# Patient Record
Sex: Male | Born: 1955 | Race: Black or African American | Hispanic: No | Marital: Single | Smoking: Never smoker
Health system: Southern US, Community
[De-identification: ages and names within clinical notes are randomized; demographics above are authoritative.]

## PROBLEM LIST (undated history)

## (undated) DIAGNOSIS — I10 Essential (primary) hypertension: Secondary | ICD-10-CM

## (undated) DIAGNOSIS — E119 Type 2 diabetes mellitus without complications: Secondary | ICD-10-CM

## (undated) HISTORY — PX: KNEE SURGERY: SHX244

---

## 2000-02-10 ENCOUNTER — Emergency Department (HOSPITAL_COMMUNITY): Admission: EM | Admit: 2000-02-10 | Discharge: 2000-02-10 | Payer: Self-pay

## 2002-10-18 ENCOUNTER — Ambulatory Visit (HOSPITAL_BASED_OUTPATIENT_CLINIC_OR_DEPARTMENT_OTHER): Admission: RE | Admit: 2002-10-18 | Discharge: 2002-10-18 | Payer: Self-pay | Admitting: Orthopaedic Surgery

## 2003-03-20 ENCOUNTER — Emergency Department (HOSPITAL_COMMUNITY): Admission: EM | Admit: 2003-03-20 | Discharge: 2003-03-21 | Payer: Self-pay | Admitting: Emergency Medicine

## 2003-04-11 ENCOUNTER — Ambulatory Visit (HOSPITAL_COMMUNITY): Admission: RE | Admit: 2003-04-11 | Discharge: 2003-04-11 | Payer: Self-pay | Admitting: Nephrology

## 2003-04-11 ENCOUNTER — Encounter: Payer: Self-pay | Admitting: Nephrology

## 2003-04-21 ENCOUNTER — Encounter: Payer: Self-pay | Admitting: Nephrology

## 2003-04-21 ENCOUNTER — Ambulatory Visit (HOSPITAL_COMMUNITY): Admission: RE | Admit: 2003-04-21 | Discharge: 2003-04-21 | Payer: Self-pay | Admitting: Nephrology

## 2003-07-24 ENCOUNTER — Encounter: Payer: Self-pay | Admitting: Nephrology

## 2003-07-24 ENCOUNTER — Encounter: Admission: RE | Admit: 2003-07-24 | Discharge: 2003-07-24 | Payer: Self-pay | Admitting: Nephrology

## 2004-10-25 ENCOUNTER — Ambulatory Visit (HOSPITAL_COMMUNITY): Admission: RE | Admit: 2004-10-25 | Discharge: 2004-10-25 | Payer: Self-pay | Admitting: Nephrology

## 2005-03-19 IMAGING — RF DG BE W/ CM - WO/W KUB
17 of 24 series · 17 of 24 positions shown · non-contrast
Comparison: none

CLINICAL DATA: The patient has a history of constipation.
 BARIUM ENEMA:
 The preliminary KUB is unremarkable.  
 No obstruction is noted to the retrograde flow of barium with the cecum being well filled with reflux of the appendix.  The terminal ileum does not reflux.  There is retained feces throughout the colon, particularly in the region of the splenic flexure.
 There are some scattered small diverticula noted particularly in the sigmoid colon.

[Series 1: run · 1 of 1 slices shown (1 of 15)]
[im 1/1]
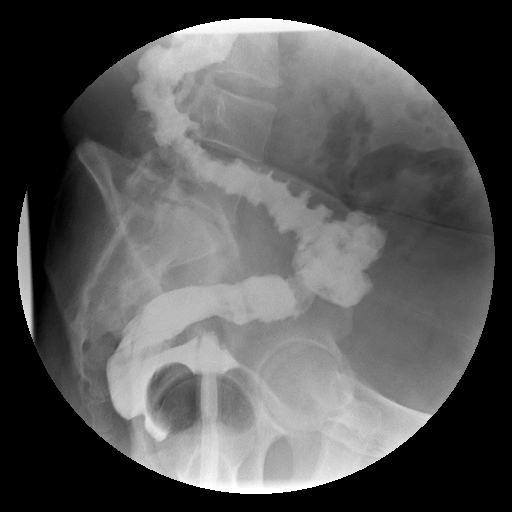

[Series 3: run · 1 of 1 slices shown (2 of 15)]
[im 1/1]
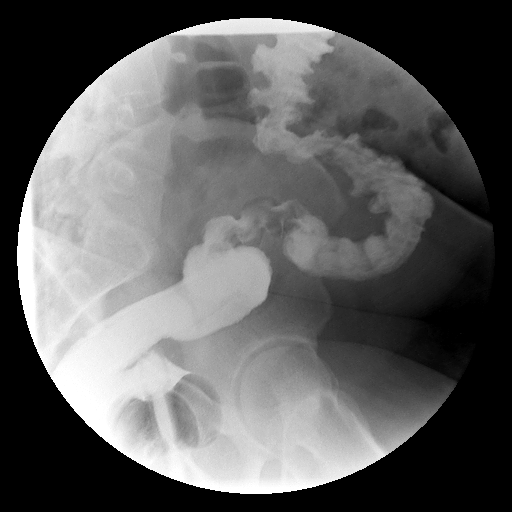

[Series 4: run · 1 of 1 slices shown (3 of 15)]
[im 1/1]
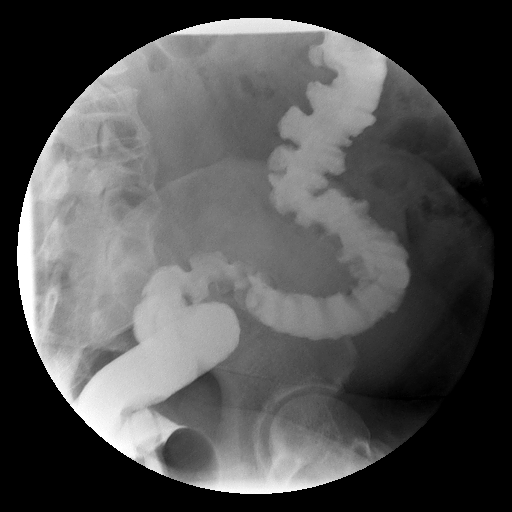

[Series 5: run · 1 of 1 slices shown (4 of 15)]
[im 1/1]
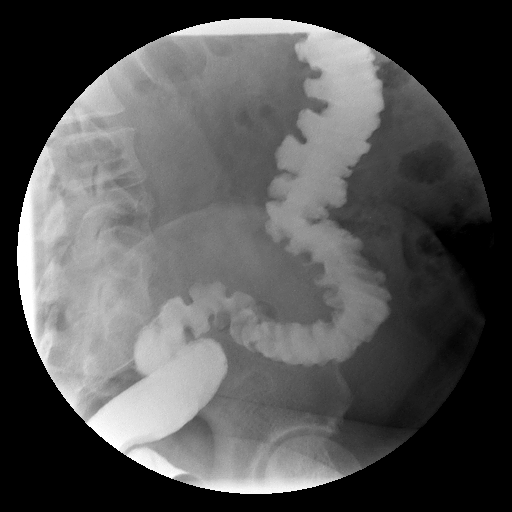

[Series 7: run · 1 of 1 slices shown (5 of 15)]
[im 1/1]
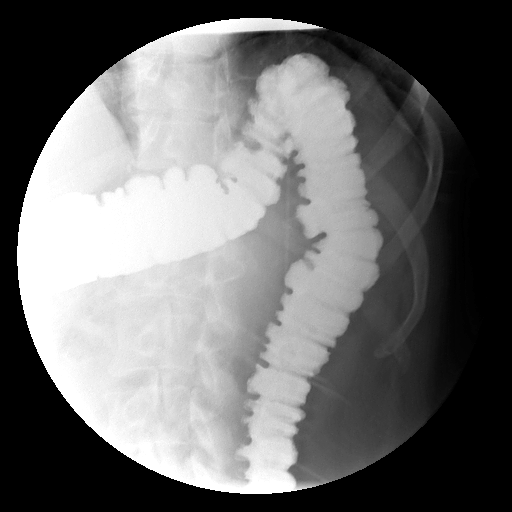

[Series 8: run · 1 of 1 slices shown (6 of 15)]
[im 1/1]
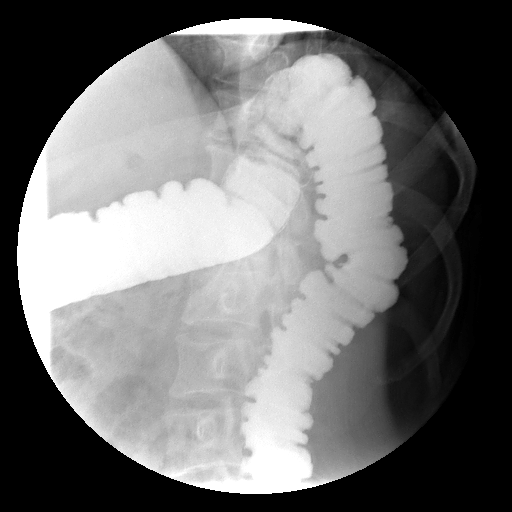

[Series 10: run · 1 of 1 slices shown (7 of 15)]
[im 1/1]
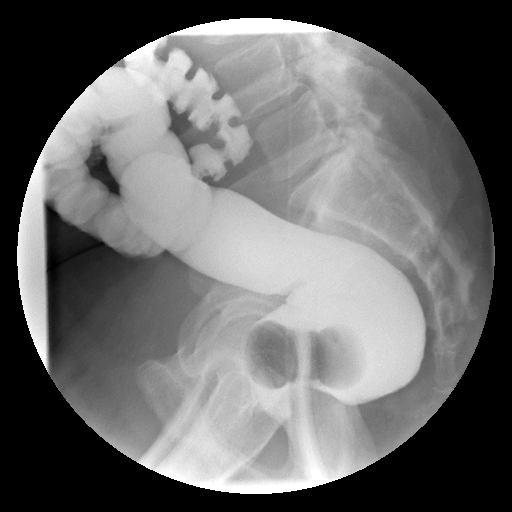

[Series 11: run · 1 of 1 slices shown (8 of 15)]
[im 1/1]
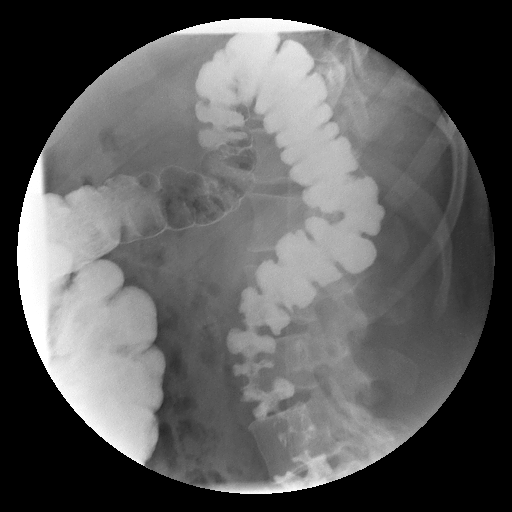

[Series 13: run · 1 of 1 slices shown (9 of 15)]
[im 1/1]
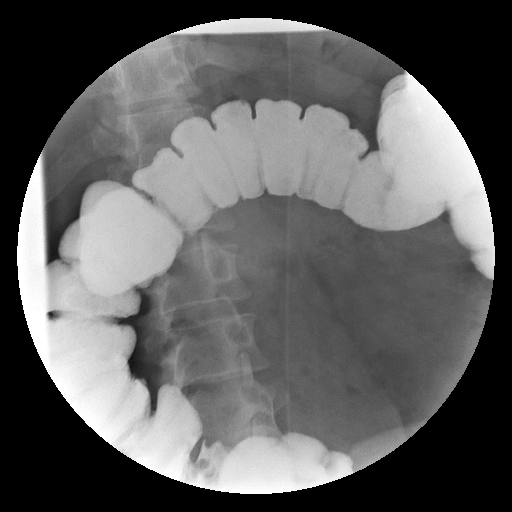

[Series 14: run · 1 of 1 slices shown (10 of 15)]
[im 1/1]
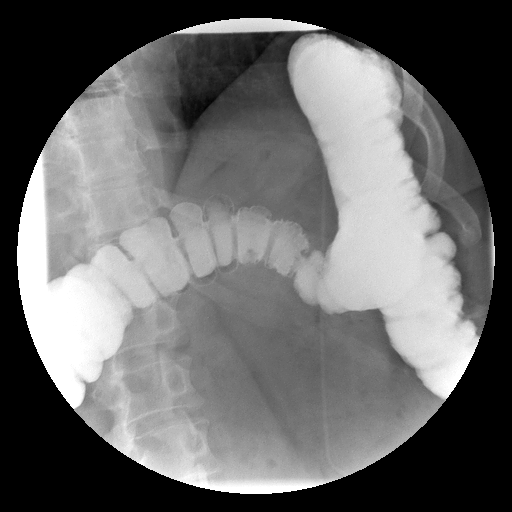

[Series 15: run · 1 of 1 slices shown (11 of 15)]
[im 1/1]
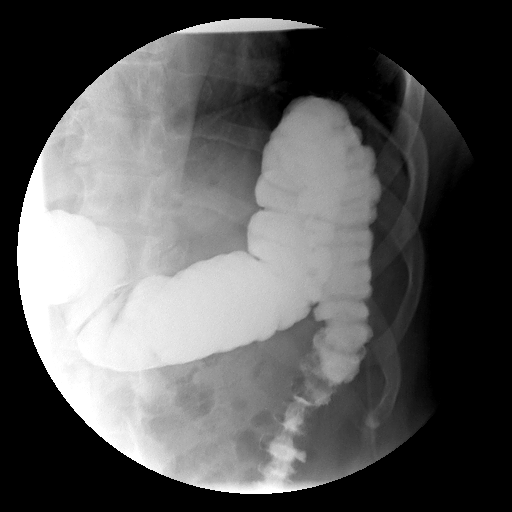

[Series 17: run · 1 of 1 slices shown (12 of 15)]
[im 1/1]
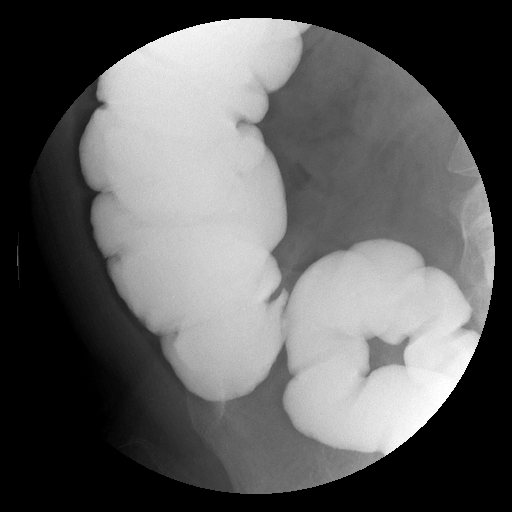

[Series 18: run · 1 of 1 slices shown (13 of 15)]
[im 1/1]
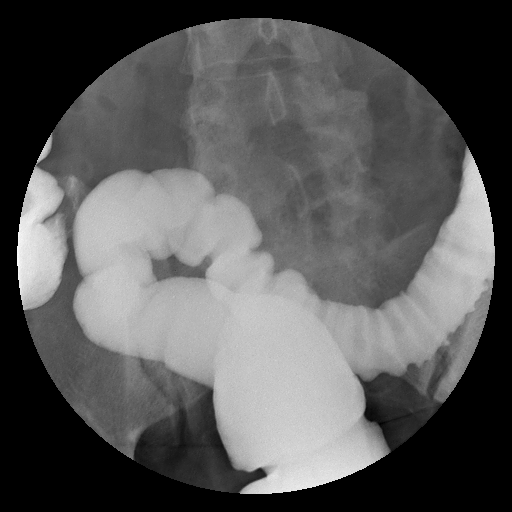

[Series 20: run · 1 of 1 slices shown (14 of 15)]
[im 1/1]
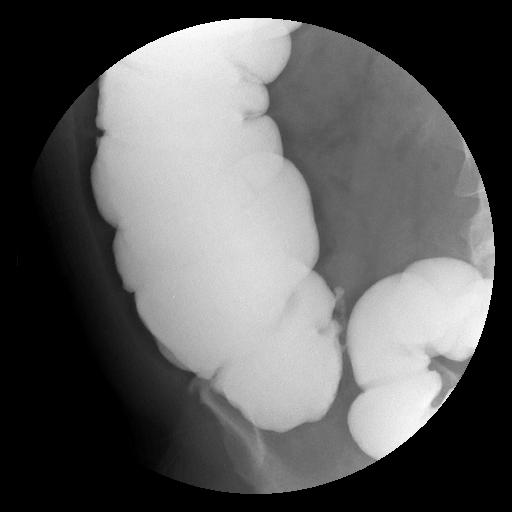

[Series 21: run · 1 of 1 slices shown (15 of 15)]
[im 1/1]
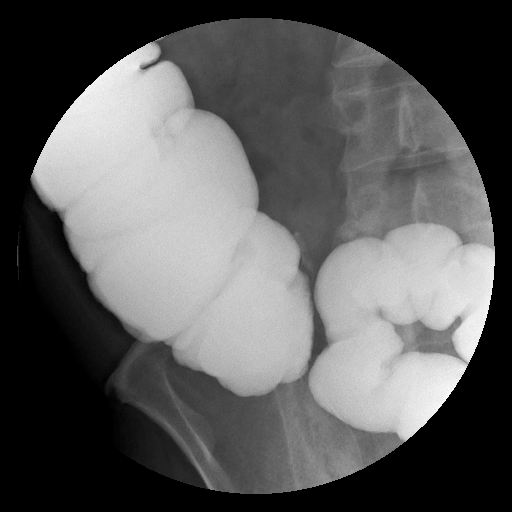

[Series 1001: view not recorded · 0.20mm/px · 1 of 1 slices shown (1 of 2)]
[im 1/1]
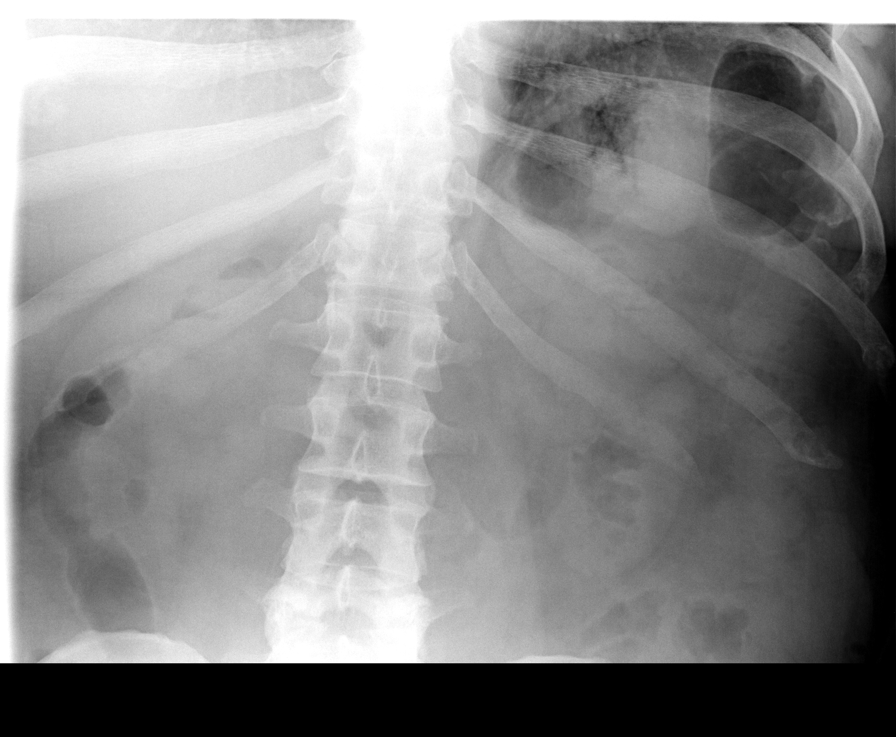

[Series 1003: view not recorded · 0.20mm/px · 1 of 1 slices shown (2 of 2)]
[im 1/1]
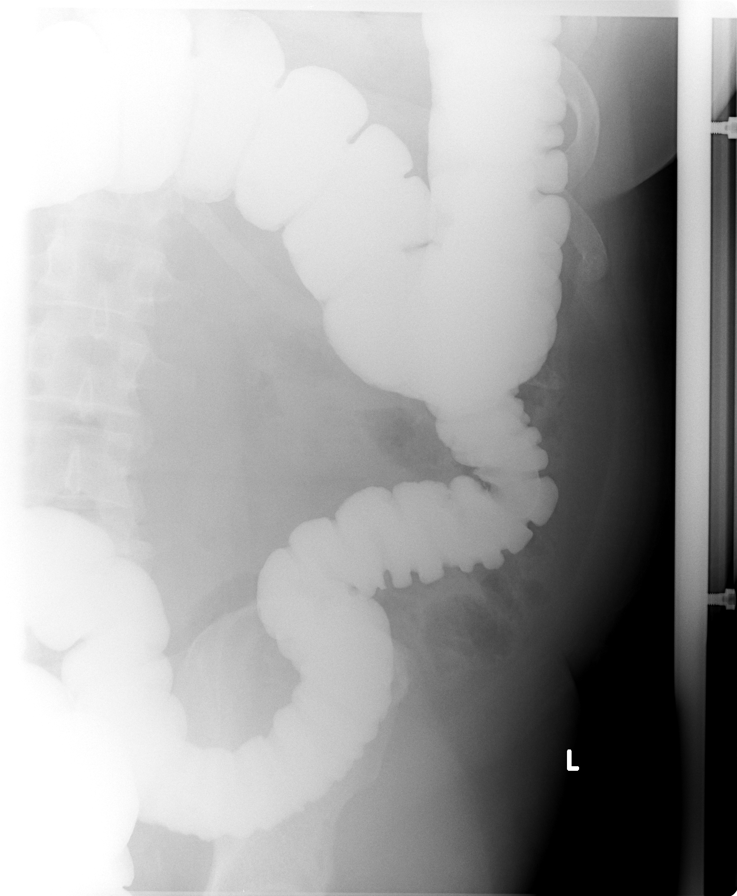

[17 of 24 positions shown; findings below may reference images not displayed]

IMPRESSION: 1.  Mild degree of diverticulosis of the sigmoid colon.
 2.  No obstructing masses.
 3.  There is retained feces.

## 2005-07-05 ENCOUNTER — Encounter (INDEPENDENT_AMBULATORY_CARE_PROVIDER_SITE_OTHER): Payer: Self-pay | Admitting: Specialist

## 2005-07-05 ENCOUNTER — Ambulatory Visit (HOSPITAL_COMMUNITY): Admission: RE | Admit: 2005-07-05 | Discharge: 2005-07-05 | Payer: Self-pay | Admitting: Gastroenterology

## 2006-08-26 ENCOUNTER — Emergency Department (HOSPITAL_COMMUNITY): Admission: EM | Admit: 2006-08-26 | Discharge: 2006-08-26 | Payer: Self-pay | Admitting: Emergency Medicine

## 2010-01-12 ENCOUNTER — Emergency Department (HOSPITAL_BASED_OUTPATIENT_CLINIC_OR_DEPARTMENT_OTHER): Admission: EM | Admit: 2010-01-12 | Discharge: 2010-01-12 | Payer: Self-pay | Admitting: Emergency Medicine

## 2011-05-06 NOTE — Op Note (Signed)
NAME:  Dalton Larson, Dalton Larson                        ACCOUNT NO.:  [id number]   MEDICAL RECORD NO.:  [id number]                   PATIENT TYPE:  AMB   LOCATION:  [facility]                                  FACILITY:  [facility]   PHYSICIAN:  Lubertha Basque. Jerl Santos, M.D.             DATE OF BIRTH:  [birth year]   DATE OF PROCEDURE:  10/18/2002  DATE OF DISCHARGE:  10/18/2002                                 OPERATIVE REPORT   PREOPERATIVE DIAGNOSIS:  Left patellar tendon rupture.   POSTOPERATIVE DIAGNOSIS:  Left patellar tendon rupture.   PROCEDURE:  Left patellar tendon repair.   ANESTHESIA:  General.   SURGEON:  Lubertha Basque. Jerl Santos, M.D.   ASSISTANT:  Lindwood Qua, P.A.   INDICATIONS FOR PROCEDURE:  The patient is a 55 year old man who injured  himself recently and has been unable to extend his leg or get around, due to  this injury.  He was seen at an Urgent Care Facility, and then in our  office.  He was noted to have a patellar tendon rupture.  He had a high-  riding patella on x-ray, and on examination had an obvious defect in the  area of his patellar tendon.  He is offered repair in hopes that he will be  able to have a functional leg once again.  The procedure was discussed with  the patient and an informed operative consent was obtained, after a  discussion of the possible complications of, reaction to anesthesia,  infection and knee stiffness.   DESCRIPTION OF PROCEDURE:  The patient was taken to the operating suite  where a general anesthetic was applied  Without difficulty.  He was positioned supine and prepped and draped in a  normal sterile fashion.  After the administration of preoperative  antibiotics, the left leg was elevated, exsanguinated, and a tourniquet  inflated about the thigh.  A longitudinal incision was made, centered over  the area of the patella tendon.  Dissection was carried down to this  complete rupture which was off the inferior pole of the patella.  Bunnell  stitches were placed using #2 Ethibond suture in the patellar tendon stump.  These were then passed through three longitudinal drill holes in the patella  and tied on the superior aspect.  This reapproximated the tendon well.  The  knee ranged to 90 degrees without much difficulty at the end of the case.  The tourniquet was deflated and the leg became pink and warm immediately.  A  mild amount of bleeding was easily controlled with the Bovie cautery.  Some  PDS suture was used to reapproximate the retinaculum which was also torn in  both aspects of the knee.  The subcutaneous tissue was reapproximated with  #0 and #2-0 undyed Vicryl, followed by a skin closure with staples.  Adaptic  was applied to the wound after some Marcaine was injected around the  incision site.  Dry gauze  and then an Ace wrap, along with a knee  immobilizer were applied.  He was admitted for overnight observation with a potential discharge home  the same day or 23-hour observation.    DISPOSITION:  The patient was extubated in the operating room and taken to  the recovery room in stable condition.   PLAN:  For him to stay overnight.  Will possibly go home the same day.                                                Lubertha Basque Jerl Santos, M.D.    PGD/MEDQ  D:  12/03/2002  T:  12/03/2002  Job:  [id number]

## 2013-02-09 ENCOUNTER — Encounter (HOSPITAL_BASED_OUTPATIENT_CLINIC_OR_DEPARTMENT_OTHER): Payer: Self-pay | Admitting: *Deleted

## 2013-02-09 ENCOUNTER — Emergency Department (HOSPITAL_BASED_OUTPATIENT_CLINIC_OR_DEPARTMENT_OTHER)
Admission: EM | Admit: 2013-02-09 | Discharge: 2013-02-09 | Disposition: A | Discharge status: Home / Self Care | Payer: Self-pay | Attending: Emergency Medicine | Admitting: Emergency Medicine

## 2013-02-09 DIAGNOSIS — I10 Essential (primary) hypertension: Secondary | ICD-10-CM | POA: Insufficient documentation

## 2013-02-09 DIAGNOSIS — E119 Type 2 diabetes mellitus without complications: Secondary | ICD-10-CM | POA: Insufficient documentation

## 2013-02-09 DIAGNOSIS — J3489 Other specified disorders of nose and nasal sinuses: Secondary | ICD-10-CM | POA: Insufficient documentation

## 2013-02-09 DIAGNOSIS — Z79899 Other long term (current) drug therapy: Secondary | ICD-10-CM | POA: Insufficient documentation

## 2013-02-09 DIAGNOSIS — R0981 Nasal congestion: Secondary | ICD-10-CM

## 2013-02-09 HISTORY — DX: Essential (primary) hypertension: I10

## 2013-02-09 HISTORY — DX: Type 2 diabetes mellitus without complications: E11.9

## 2013-02-09 MED ORDER — OXYMETAZOLINE HCL 0.05 % NA SOLN
2.0000 | Freq: Two times a day (BID) | NASAL | Status: DC | PRN
  Administered 2013-02-09: 2 via NASAL
  Filled 2013-02-09: qty 15

## 2013-02-09 NOTE — ED Notes (Signed)
Pt c/o sinus congestion x2-3 days and sts he is short of breath. Pt sts no relief from OTC meds.

## 2013-02-09 NOTE — ED Provider Notes (Signed)
History     CSN: [id number]  Arrival date & time 02/09/13  0235   First MD Initiated Contact with Patient 02/09/13 [phone]      Chief Complaint  Patient presents with  . Nasal Congestion    (Consider location/radiation/quality/duration/timing/severity/associated sxs/prior treatment) HPI This is a 57 year old male with a three-day history of nasal congestion and sinus pressure. He states it worsened within the last 24 hours. He considers his symptoms moderate to severe. He denies frank pain. He has had a subjective fever. He denies cough, nausea or vomiting. He has had diarrhea for the past 2 days. He has been trying an over-the-counter product without relief. He has not tried any nasal sprays.  Past Medical History  Diagnosis Date  . Diabetes mellitus without complication   . Hypertension     Past Surgical History  Procedure Laterality Date  . Knee surgery      No family history on file.  History  Substance Use Topics  . Smoking status: Never Smoker   . Smokeless tobacco: Not on file  . Alcohol Use: No      Review of Systems  All other systems reviewed and are negative.    Allergies  Review of patient's allergies indicates no known allergies.  Home Medications   Current Outpatient Rx  Name  Route  Sig  Dispense  Refill  . amLODipine (NORVASC) 5 MG tablet   Oral   Take 5 mg by mouth daily.         . metFORMIN (GLUCOPHAGE) 500 MG tablet   Oral   Take 500 mg by mouth 2 (two) times daily with a meal.           BP 158/79  Pulse 96  Temp(Src) 97.6 F (36.4 C) (Oral)  Resp 20  Ht 6\' 1"  (1.854 m)  Wt 250 lb (113.399 kg)  BMI 32.99 kg/m2  SpO2 100%  Physical Exam General: Well-developed, well-nourished male in no acute distress; appearance consistent with age of record HENT: normocephalic, atraumatic; edema and mild erythema of the nasal turbinates with drainage noted Eyes: pupils equal round and reactive to light; extraocular muscles intact Neck:  supple Heart: regular rate and rhythm Lungs: clear to auscultation bilaterally Abdomen: soft; nondistended Extremities: No deformity; full range of motion Neurologic: Awake, alert and oriented; motor function intact in all extremities and symmetric; no facial droop Skin: Warm and dry Psychiatric: Normal mood and affect    ED Course  Procedures (including critical care time)     MDM          Hanley Seamen, MD 02/09/13 4782

## 2013-02-09 NOTE — ED Notes (Signed)
MD at bedside. 

## 2013-10-30 ENCOUNTER — Ambulatory Visit: Payer: Self-pay

## 2013-11-11 ENCOUNTER — Ambulatory Visit: Payer: Self-pay | Admitting: Internal Medicine

## 2013-12-18 ENCOUNTER — Ambulatory Visit: Payer: Self-pay | Admitting: Internal Medicine

## 2013-12-27 ENCOUNTER — Ambulatory Visit: Payer: Self-pay | Admitting: Internal Medicine

## 2014-01-17 ENCOUNTER — Encounter: Payer: Self-pay | Admitting: Internal Medicine

## 2014-01-17 ENCOUNTER — Ambulatory Visit: Payer: Self-pay | Attending: Internal Medicine | Admitting: Internal Medicine

## 2014-01-17 VITALS — BP 136/84 | HR 88 | Temp 98.7°F | Resp 14 | Ht 73.0 in | Wt 247.2 lb

## 2014-01-17 DIAGNOSIS — I10 Essential (primary) hypertension: Secondary | ICD-10-CM

## 2014-01-17 DIAGNOSIS — E119 Type 2 diabetes mellitus without complications: Secondary | ICD-10-CM

## 2014-01-17 LAB — COMPLETE METABOLIC PANEL WITH GFR
ALT: 25 U/L (ref 0–53)
AST: 23 U/L (ref 0–37)
Albumin: 4.2 g/dL (ref 3.5–5.2)
Alkaline Phosphatase: 85 U/L (ref 39–117)
BUN: 12 mg/dL (ref 6–23)
CO2: 28 mEq/L (ref 19–32)
Calcium: 9.7 mg/dL (ref 8.4–10.5)
Chloride: 102 mEq/L (ref 96–112)
Creat: 0.83 mg/dL (ref 0.50–1.35)
GFR, Est African American: >89 mL/min
GFR, Est Non African American: >89 mL/min
Glucose, Bld: 170 mg/dL — ABNORMAL HIGH (ref 70–99)
Potassium: 4.2 mEq/L (ref 3.5–5.3)
Sodium: 137 mEq/L (ref 135–145)
Total Bilirubin: 0.4 mg/dL (ref 0.2–1.2)
Total Protein: 6.8 g/dL (ref 6.0–8.3)

## 2014-01-17 LAB — CBC WITH DIFFERENTIAL/PLATELET
Basophils Absolute: 0 10*3/uL (ref 0.0–0.1)
Basophils Relative: 0 % (ref 0–1)
Eosinophils Absolute: 0.2 10*3/uL (ref 0.0–0.7)
Eosinophils Relative: 2 % (ref 0–5)
HCT: 44.2 % (ref 39.0–52.0)
Hemoglobin: 15.2 g/dL (ref 13.0–17.0)
Lymphocytes Relative: 37 % (ref 12–46)
Lymphs Abs: 3.3 10*3/uL (ref 0.7–4.0)
MCH: 28.6 pg (ref 26.0–34.0)
MCHC: 34.4 g/dL (ref 30.0–36.0)
MCV: 83.2 fL (ref 78.0–100.0)
Monocytes Absolute: 0.7 10*3/uL (ref 0.1–1.0)
Monocytes Relative: 8 % (ref 3–12)
Neutro Abs: 4.6 10*3/uL (ref 1.7–7.7)
Neutrophils Relative %: 53 % (ref 43–77)
Platelets: 243 10*3/uL (ref 150–400)
RBC: 5.31 MIL/uL (ref 4.22–5.81)
RDW: 14.6 % (ref 11.5–15.5)
WBC: 8.8 10*3/uL (ref 4.0–10.5)

## 2014-01-17 LAB — LIPID PANEL
Cholesterol: 252 mg/dL — ABNORMAL HIGH (ref 0–200)
HDL: 40 mg/dL (ref >39)
LDL Cholesterol: 134 mg/dL — ABNORMAL HIGH (ref 0–99)
Total CHOL/HDL Ratio: 6.3 Ratio
Triglycerides: 390 mg/dL — ABNORMAL HIGH (ref <150)
VLDL: 78 mg/dL — ABNORMAL HIGH (ref 0–40)

## 2014-01-17 LAB — POCT GLYCOSYLATED HEMOGLOBIN (HGB A1C): Hemoglobin A1C: 9.4

## 2014-01-17 MED ORDER — METFORMIN HCL 500 MG PO TABS: 500.0000 mg | ORAL_TABLET | Freq: Two times a day (BID) | ORAL | Status: DC

## 2014-01-17 MED ORDER — AMLODIPINE BESYLATE 5 MG PO TABS: 5.0000 mg | ORAL_TABLET | Freq: Every day | ORAL | Status: DC

## 2014-01-17 NOTE — Progress Notes (Signed)
Pt is here to establish care. Requests a physical and blood work. Pt is a Type II Diabetic. Pt also requests a blood test for testosterone.

## 2014-01-17 NOTE — Progress Notes (Signed)
Patient ID: Dalton Larson, male   DOB: April 08, 1956, 58 y.o.   MRN: [id number]   CC:  HPI:  58 year old male with a history of diabetes,hypertension, here to establish care. The patient his concern about his diabetes. The patient cannot afford to buy insurance, because of his diabetes. His last A1c was several years ago. Yesterday's CBg was 190.he denies any cardiopulmonary symptoms. No other specific complaints  He states that he had a colonoscopy in 2007 and was asked to come back in 5 years, but could not afford it  Social history Nonsmoker nonalcoholic  Family history positive for diabetes in his brother Hypertension in his mother also congestive heart failure     No Known Allergies Past Medical History  Diagnosis Date  . Diabetes mellitus without complication   . Hypertension    No current outpatient prescriptions on file prior to visit.   No current facility-administered medications on file prior to visit.   History reviewed. No pertinent family history. History   Social History  . Marital Status: Single    Spouse Name: N/A    Number of Children: N/A  . Years of Education: N/A   Occupational History  . Not on file.   Social History Main Topics  . Smoking status: Never Smoker   . Smokeless tobacco: Not on file  . Alcohol Use: No  . Drug Use: Not on file  . Sexual Activity: Not on file   Other Topics Concern  . Not on file   Social History Narrative  . No narrative on file    Review of Systems  Constitutional: Negative for fever, chills, diaphoresis, activity change, appetite change and fatigue.  HENT: Negative for ear pain, nosebleeds, congestion, facial swelling, rhinorrhea, neck pain, neck stiffness and ear discharge.   Eyes: Negative for pain, discharge, redness, itching and visual disturbance.  Respiratory: Negative for cough, choking, chest tightness, shortness of breath, wheezing and stridor.   Cardiovascular: Negative for chest pain,  palpitations and leg swelling.  Gastrointestinal: Negative for abdominal distention.  Genitourinary: Negative for dysuria, urgency, frequency, hematuria, flank pain, decreased urine volume, difficulty urinating and dyspareunia.  Musculoskeletal: Negative for back pain, joint swelling, arthralgias and gait problem.  Neurological: Negative for dizziness, tremors, seizures, syncope, facial asymmetry, speech difficulty, weakness, light-headedness, numbness and headaches.  Hematological: Negative for adenopathy. Does not bruise/bleed easily.  Psychiatric/Behavioral: Negative for hallucinations, behavioral problems, confusion, dysphoric mood, decreased concentration and agitation.    Objective:   Filed Vitals:   01/17/14 1637  BP: 136/84  Pulse: 88  Temp: 98.7 F (37.1 C)  Resp: 14    Physical Exam  Constitutional: Appears well-developed and well-nourished. No distress.  HENT: Normocephalic. External right and left ear normal. Oropharynx is clear and moist.  Eyes: Conjunctivae and EOM are normal. PERRLA, no scleral icterus.  Neck: Normal ROM. Neck supple. No JVD. No tracheal deviation. No thyromegaly.  CVS: RRR, S1/S2 +, no murmurs, no gallops, no carotid bruit.  Pulmonary: Effort and breath sounds normal, no stridor, rhonchi, wheezes, rales.  Abdominal: Soft. BS +,  no distension, tenderness, rebound or guarding.  Musculoskeletal: Normal range of motion. No edema and no tenderness.  Lymphadenopathy: No lymphadenopathy noted, cervical, inguinal. Neuro: Alert. Normal reflexes, muscle tone coordination. No cranial nerve deficit. Skin: Skin is warm and dry. No rash noted. Not diaphoretic. No erythema. No pallor.  Psychiatric: Normal mood and affect. Behavior, judgment, thought content normal.   No results found for this basename: WBC, HGB, HCT, MCV, PLT  No results found for this basename: CREATININE, BUN, NA, K, CL, CO2    No results found for this basename: HGBA1C   Lipid Panel   No results found for this basename: chol, trig, hdl, cholhdl, vldl, ldlcalc       Assessment and plan:   There are no active problems to display for this patient.  Diabetes A1c, refill metformin Ophthalmology referral for, retinal scan  Hypertension Refilled Norvasc Check renal panel, electrolytes   History of Low testosterone  according to the patient he has no libido, recheck testosterone levels   Establish care Colonoscopy referral Patient refuses tetanus, flu, hepatitis B vaccination He is to start his orange card application     followup in 3 months  The patient was given clear instructions to go to ER or return to medical center if symptoms don't improve, worsen or new problems develop. The patient verbalized understanding. The patient was told to call to get any lab results if not heard anything in the next week.

## 2014-01-18 LAB — TSH: TSH: 0.777 u[IU]/mL (ref 0.350–4.500)

## 2014-01-18 LAB — TESTOSTERONE: Testosterone: 225 ng/dL — ABNORMAL LOW (ref 300–890)

## 2014-01-21 MED ORDER — METFORMIN HCL 500 MG PO TABS: 1000.0000 mg | ORAL_TABLET | Freq: Two times a day (BID) | ORAL | Status: DC

## 2014-01-21 MED ORDER — GEMFIBROZIL 600 MG PO TABS: 600.0000 mg | ORAL_TABLET | Freq: Two times a day (BID) | ORAL | Status: AC

## 2014-01-21 NOTE — Addendum Note (Signed)
Addended by: Susie CassetteABROL MD, Germain OsgoodNAYANA on: 01/21/2014 02:21 PM   Modules accepted: Orders

## 2014-01-22 ENCOUNTER — Telehealth: Payer: Self-pay | Admitting: Internal Medicine

## 2014-01-22 NOTE — Telephone Encounter (Signed)
Pt is calling in today to get the results of their bloodwork from Friday 01/17/14; please f/u with pt

## 2014-01-23 ENCOUNTER — Telehealth: Payer: Self-pay | Admitting: *Deleted

## 2014-01-23 NOTE — Telephone Encounter (Signed)
Pt requests a dentist referral.

## 2014-01-23 NOTE — Telephone Encounter (Signed)
Message copied by Kemper Hochman, [place] on Thu Jan 23, 2014  4:38 PM ------      Message from: Susie CassetteABROL MD, Germain OsgoodNAYANA      Created: Tue Jan 21, 2014  2:22 PM       Patient that the labs are abnormal, triglycerides of 390, A1c is 9.4      Prescribed Lopid 600 twice a day and metformin thousand milligrams twice a day to [facility] ------

## 2014-01-23 NOTE — Telephone Encounter (Signed)
Pt given lab results with instructions to start taking prescribed Lopid 600 mg and continue taking Metformin 1,000 BID

## 2014-01-27 ENCOUNTER — Telehealth: Payer: Self-pay | Admitting: *Deleted

## 2014-01-27 NOTE — Telephone Encounter (Signed)
Pt has called in to get a prescription for testosterone medication. Pt has called in several times trying to get a prescription.

## 2014-02-24 ENCOUNTER — Encounter: Payer: Self-pay | Admitting: Internal Medicine

## 2014-03-04 ENCOUNTER — Ambulatory Visit: Payer: Self-pay | Admitting: Internal Medicine

## 2014-03-17 ENCOUNTER — Ambulatory Visit: Payer: Self-pay | Admitting: Internal Medicine

## 2014-07-28 ENCOUNTER — Other Ambulatory Visit: Payer: Self-pay | Admitting: *Deleted

## 2014-07-28 DIAGNOSIS — E119 Type 2 diabetes mellitus without complications: Secondary | ICD-10-CM

## 2014-07-28 MED ORDER — METFORMIN HCL 500 MG PO TABS: 1000.0000 mg | ORAL_TABLET | Freq: Two times a day (BID) | ORAL | Status: AC

## 2014-08-05 ENCOUNTER — Other Ambulatory Visit: Payer: Self-pay

## 2014-09-04 ENCOUNTER — Telehealth: Payer: Self-pay

## 2014-09-04 MED ORDER — AMLODIPINE BESYLATE 5 MG PO TABS: 5.0000 mg | ORAL_TABLET | Freq: Every day | ORAL | Status: AC

## 2014-09-04 NOTE — Telephone Encounter (Signed)
Patient requesting a refill on his blood pressure medication Medication sent to [facility] on [address]

## 2014-11-05 ENCOUNTER — Telehealth: Payer: Self-pay | Admitting: Internal Medicine

## 2014-11-05 ENCOUNTER — Telehealth: Payer: Self-pay | Admitting: Emergency Medicine

## 2014-11-05 NOTE — Telephone Encounter (Signed)
Patient calling to request medication refill for all of his medications. Patient has not been seen in office since 01/17/2014. Patient informed that an appointment is necessary before any more meds can be refilled. 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 States that his schedule does not allow for him to come in. Patient insist on speaking to nurse. Please assist.

## 2014-11-05 NOTE — Telephone Encounter (Signed)
Pt called in requesting medication refills on all medication Pt was last seen in clinic 01/17/2014. I informed pt I would given him 30 day supply on medication if he scheduled OV. Pt stated " I will not make an appointment"

## 2014-11-20 ENCOUNTER — Other Ambulatory Visit: Payer: Self-pay

## 2015-03-31 ENCOUNTER — Other Ambulatory Visit: Payer: Self-pay | Admitting: Internal Medicine

## 2015-06-15 ENCOUNTER — Other Ambulatory Visit: Payer: Self-pay
